# Patient Record
Sex: Male | Born: 1965 | Race: Black or African American | Hispanic: No | Marital: Married | Smoking: Current every day smoker
Health system: Southern US, Community
[De-identification: ages and names within clinical notes are randomized; demographics above are authoritative.]

## PROBLEM LIST (undated history)

## (undated) DIAGNOSIS — F209 Schizophrenia, unspecified: Secondary | ICD-10-CM

## (undated) DIAGNOSIS — F319 Bipolar disorder, unspecified: Secondary | ICD-10-CM

## (undated) HISTORY — PX: ABDOMINAL SURGERY: SHX537

---

## 2021-07-06 ENCOUNTER — Emergency Department (HOSPITAL_COMMUNITY): Payer: Medicaid Other

## 2021-07-06 ENCOUNTER — Other Ambulatory Visit: Payer: Self-pay

## 2021-07-06 ENCOUNTER — Emergency Department (HOSPITAL_COMMUNITY)
Admission: EM | Admit: 2021-07-06 | Discharge: 2021-07-06 | Disposition: A | Discharge status: Home / Self Care | Payer: Medicaid Other | Attending: Emergency Medicine | Admitting: Emergency Medicine

## 2021-07-06 ENCOUNTER — Encounter (HOSPITAL_COMMUNITY): Payer: Self-pay

## 2021-07-06 DIAGNOSIS — J189 Pneumonia, unspecified organism: Secondary | ICD-10-CM

## 2021-07-06 DIAGNOSIS — F17210 Nicotine dependence, cigarettes, uncomplicated: Secondary | ICD-10-CM | POA: Diagnosis not present

## 2021-07-06 DIAGNOSIS — K828 Other specified diseases of gallbladder: Secondary | ICD-10-CM | POA: Insufficient documentation

## 2021-07-06 DIAGNOSIS — Y9389 Activity, other specified: Secondary | ICD-10-CM | POA: Diagnosis not present

## 2021-07-06 DIAGNOSIS — M7021 Olecranon bursitis, right elbow: Secondary | ICD-10-CM | POA: Insufficient documentation

## 2021-07-06 DIAGNOSIS — R1110 Vomiting, unspecified: Secondary | ICD-10-CM | POA: Diagnosis present

## 2021-07-06 HISTORY — DX: Schizophrenia, unspecified: F20.9

## 2021-07-06 HISTORY — DX: Bipolar disorder, unspecified: F31.9

## 2021-07-06 LAB — URINALYSIS, ROUTINE W REFLEX MICROSCOPIC
Bilirubin Urine: NEGATIVE
Glucose, UA: NEGATIVE mg/dL
Hgb urine dipstick: NEGATIVE
Ketones, ur: NEGATIVE mg/dL
Leukocytes,Ua: NEGATIVE
Nitrite: NEGATIVE
Protein, ur: NEGATIVE mg/dL
Specific Gravity, Urine: <1.005 — ABNORMAL LOW (ref 1.005–1.030)
pH: 6 (ref 5.0–8.0)

## 2021-07-06 LAB — COMPREHENSIVE METABOLIC PANEL
ALT: 29 U/L (ref 0–44)
AST: 61 U/L — ABNORMAL HIGH (ref 15–41)
Albumin: 3.1 g/dL — ABNORMAL LOW (ref 3.5–5.0)
Alkaline Phosphatase: 59 U/L (ref 38–126)
Anion gap: 17 — ABNORMAL HIGH (ref 5–15)
BUN: 5 mg/dL — ABNORMAL LOW (ref 6–20)
CO2: 18 mmol/L — ABNORMAL LOW (ref 22–32)
Calcium: 8.3 mg/dL — ABNORMAL LOW (ref 8.9–10.3)
Chloride: 101 mmol/L (ref 98–111)
Creatinine, Ser: 0.53 mg/dL — ABNORMAL LOW (ref 0.61–1.24)
GFR, Estimated: >60 mL/min (ref >60)
Glucose, Bld: 70 mg/dL (ref 70–99)
Potassium: 3.2 mmol/L — ABNORMAL LOW (ref 3.5–5.1)
Sodium: 136 mmol/L (ref 135–145)
Total Bilirubin: 0.9 mg/dL (ref 0.3–1.2)
Total Protein: 6.2 g/dL — ABNORMAL LOW (ref 6.5–8.1)

## 2021-07-06 LAB — CBC
HCT: 41.8 % (ref 39.0–52.0)
Hemoglobin: 14 g/dL (ref 13.0–17.0)
MCH: 35.8 pg — ABNORMAL HIGH (ref 26.0–34.0)
MCHC: 33.5 g/dL (ref 30.0–36.0)
MCV: 106.9 fL — ABNORMAL HIGH (ref 80.0–100.0)
Platelets: 80 10*3/uL — ABNORMAL LOW (ref 150–400)
RBC: 3.91 MIL/uL — ABNORMAL LOW (ref 4.22–5.81)
RDW: 12.7 % (ref 11.5–15.5)
WBC: 8.3 10*3/uL (ref 4.0–10.5)
nRBC: 0 % (ref 0.0–0.2)

## 2021-07-06 LAB — LIPASE, BLOOD: Lipase: 48 U/L (ref 11–51)

## 2021-07-06 MED ORDER — AZITHROMYCIN 250 MG PO TABS: 250.0000 mg | ORAL_TABLET | Freq: Every day | ORAL | 0 refills | Status: DC

## 2021-07-06 MED ORDER — AMOXICILLIN 500 MG PO CAPS: 1000.0000 mg | ORAL_CAPSULE | Freq: Three times a day (TID) | ORAL | 0 refills | Status: AC

## 2021-07-06 MED ORDER — ONDANSETRON 4 MG PO TBDP
4.0000 mg | ORAL_TABLET | Freq: Once | ORAL | Status: AC | PRN
  Administered 2021-07-06: 4 mg via ORAL
  Filled 2021-07-06: qty 1

## 2021-07-06 MED ORDER — ONDANSETRON 4 MG PO TBDP: 4.0000 mg | ORAL_TABLET | Freq: Three times a day (TID) | ORAL | 0 refills | Status: DC | PRN

## 2021-07-06 MED ORDER — SODIUM CHLORIDE 0.9 % IV BOLUS
500.0000 mL | Freq: Once | INTRAVENOUS | Status: AC
  Administered 2021-07-06: 500 mL via INTRAVENOUS

## 2021-07-06 MED ORDER — IOHEXOL 350 MG/ML SOLN
80.0000 mL | Freq: Once | INTRAVENOUS | Status: AC | PRN
  Administered 2021-07-06: 80 mL via INTRAVENOUS

## 2021-07-06 NOTE — ED Notes (Signed)
Patient transported to Ultrasound 

## 2021-07-06 NOTE — Discharge Instructions (Signed)
Your workup today showed a pneumonia, so we are treating with antibiotics.  Your gallbladder showed sludge, which is a thick fluid. This can sometimes cause abdominal pain and nausea after eating. I recommend you follow up with a general surgery team (clinic listed below) for further evaluation and management of your gallbladder.  Your elbow will need to be managed by orthopedics. You may call the office listed below to set up a follow up appointment.  Return to the ER if you develop fevers, difficulty breathing, severe worsening abdominal pain, or any new, worsening or concerning symptoms.
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
## 2021-07-06 NOTE — ED Notes (Signed)
Pt provide lunch bag and ginger ale per provider

## 2021-07-06 NOTE — ED Triage Notes (Addendum)
Pt bib [employer]. Pt has not been able to eat for a month per family. Pt states he has been feeling bad and vomiting for past 4 days. Pt c/o R elbow and neck pain.   EMS VS 108/76 HR=70,  98% room air Cbg=92  20g LFA started, pt received NS given by EMS

## 2021-08-21 ENCOUNTER — Telehealth: Payer: Self-pay

## 2021-08-21 NOTE — Telephone Encounter (Signed)
Pt called requesting wound care supplies for his wound. Pt states he is completely out of them 

## 2021-08-22 NOTE — Telephone Encounter (Signed)
I don't see that we've seen him

## 2021-08-22 NOTE — Telephone Encounter (Signed)
Error on message from 08/21/21 at 5:04 PM

## 2022-09-21 IMAGING — US US ABDOMEN LIMITED
1 series · 14 of 25 positions shown · non-contrast
Comparison: None.

CLINICAL DATA: 55-year-old male with abdominal pain.

EXAM:
ULTRASOUND ABDOMEN LIMITED RIGHT UPPER QUADRANT

[Series 1: us abdomen limited ruq (liver/gb) · 14 of 46 slices shown]
[im 1/46]
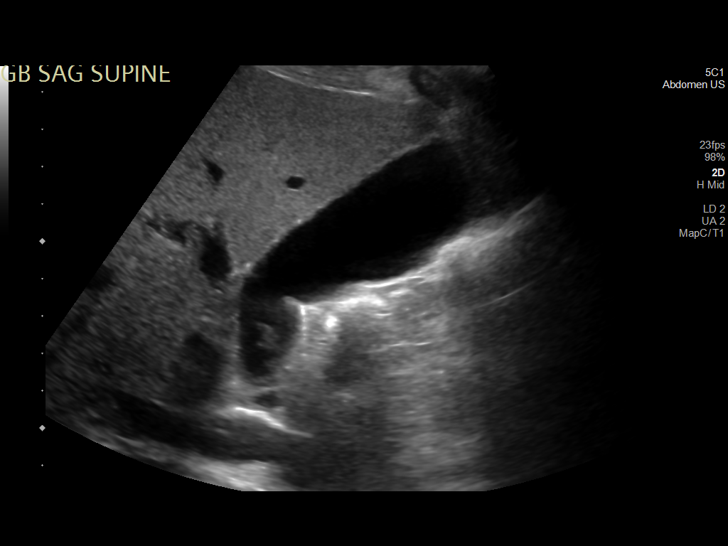
[im 4/46]
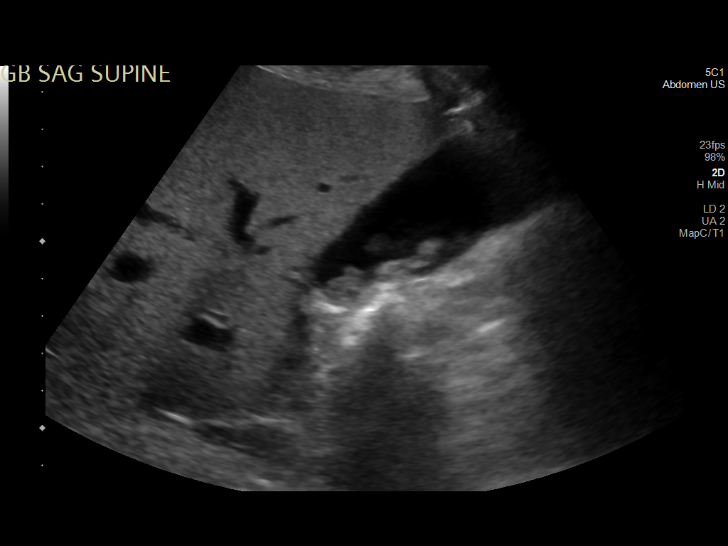
[im 8/46]
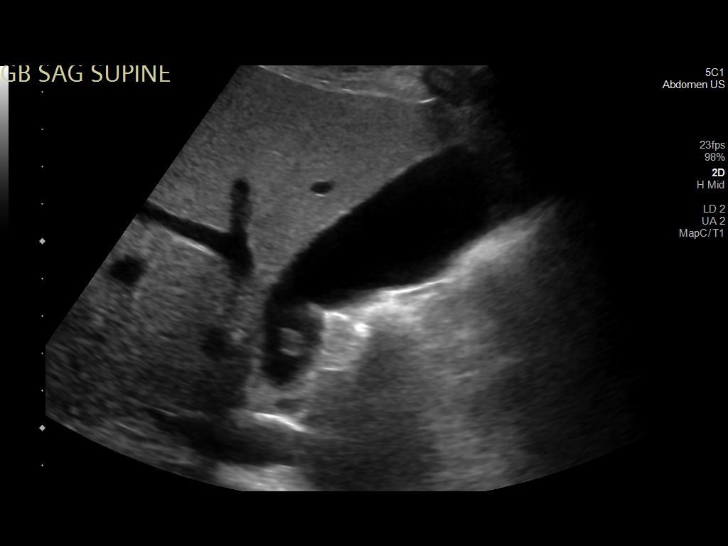
[im 12/46]
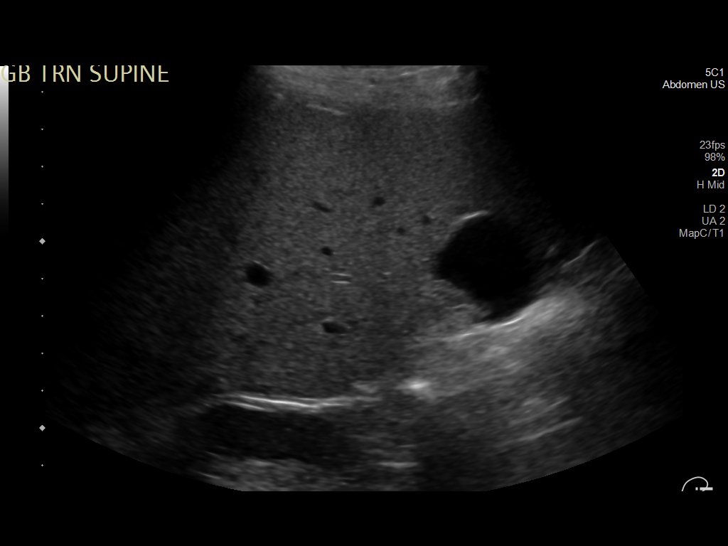
[im 16/46]
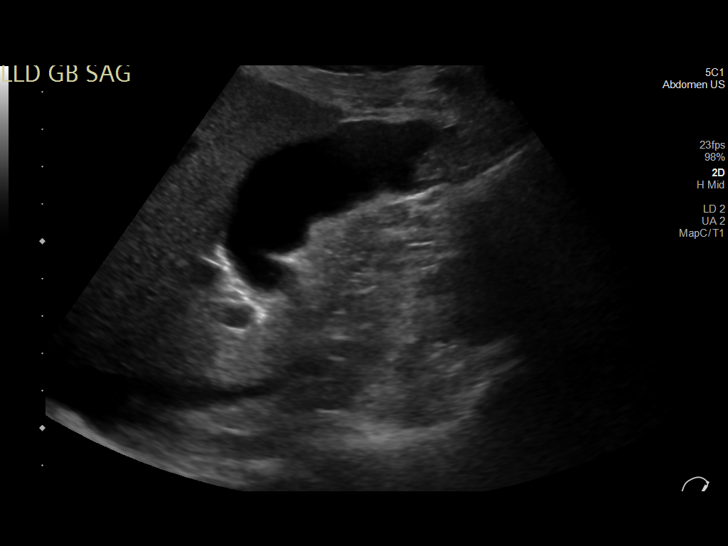
[im 17/46]
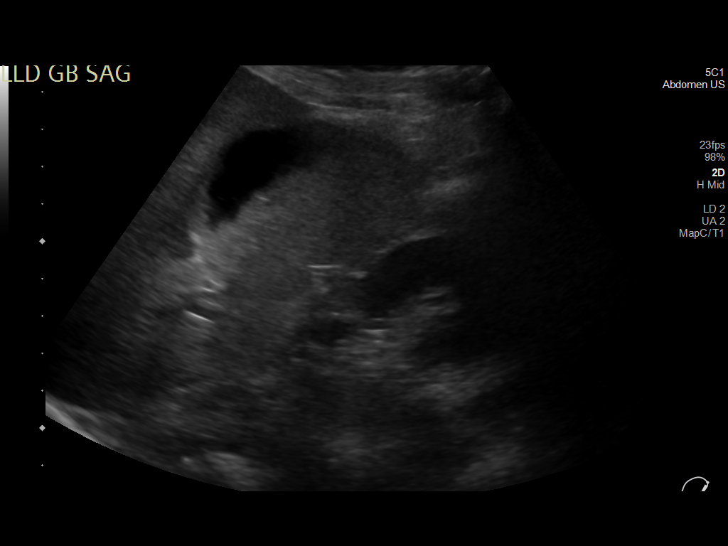
[im 21/46]
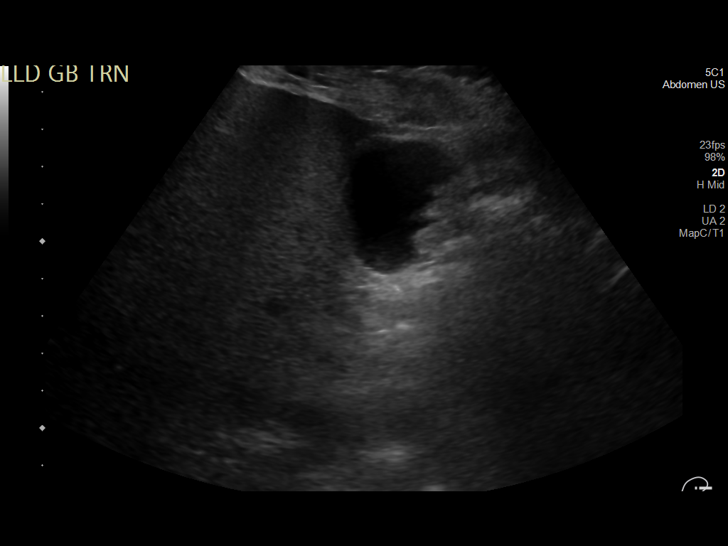
[im 25/46]
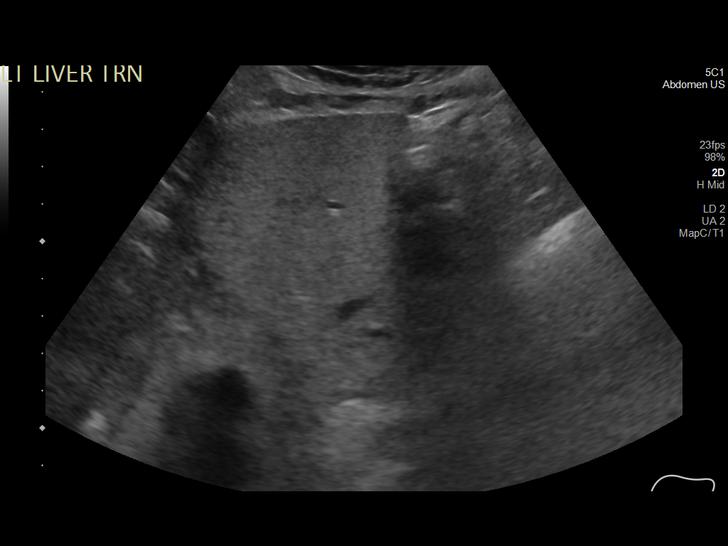
[im 29/46]
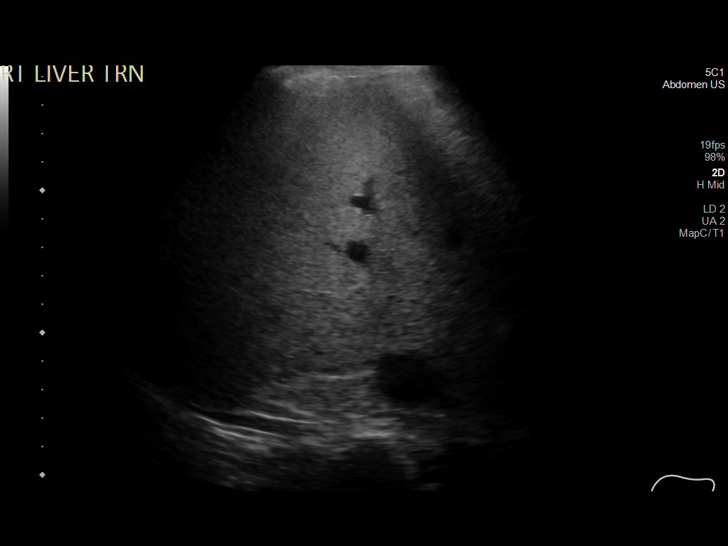
[im 31/46]
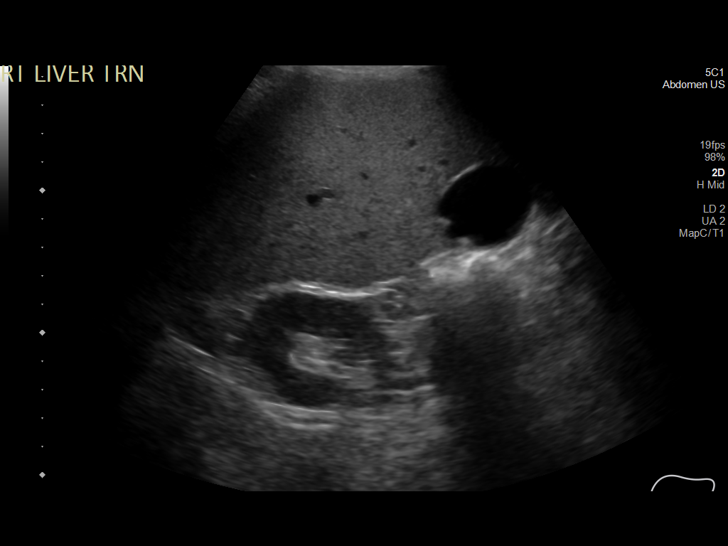
[im 34/46]
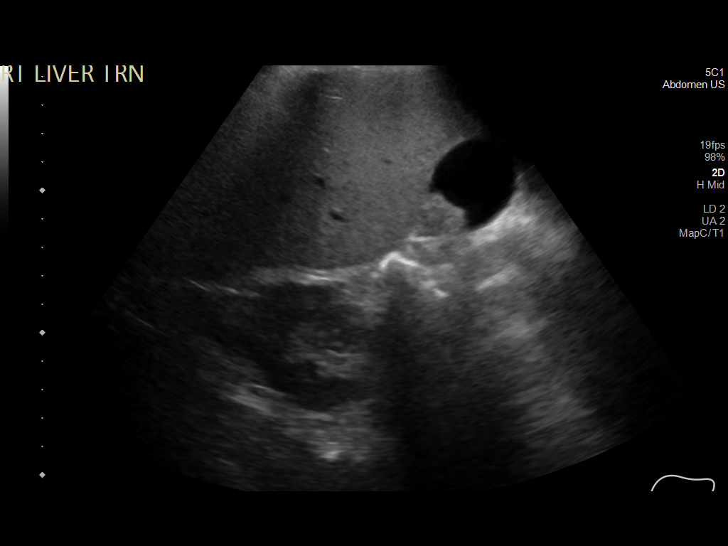
[im 38/46]
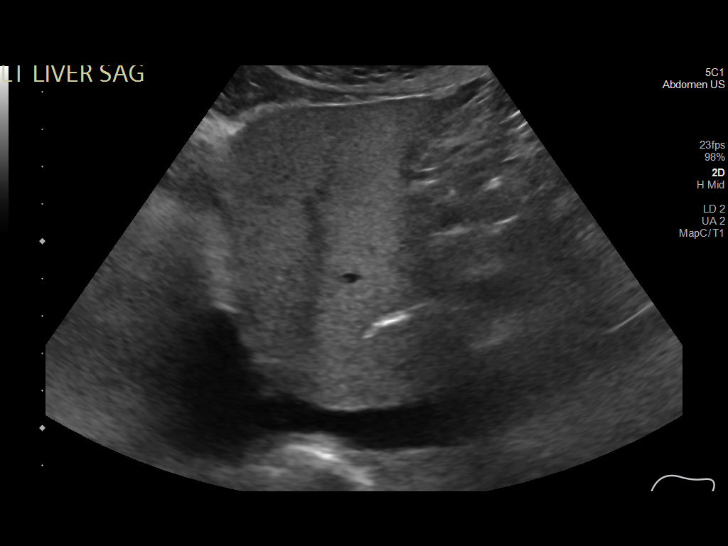
[im 42/46]
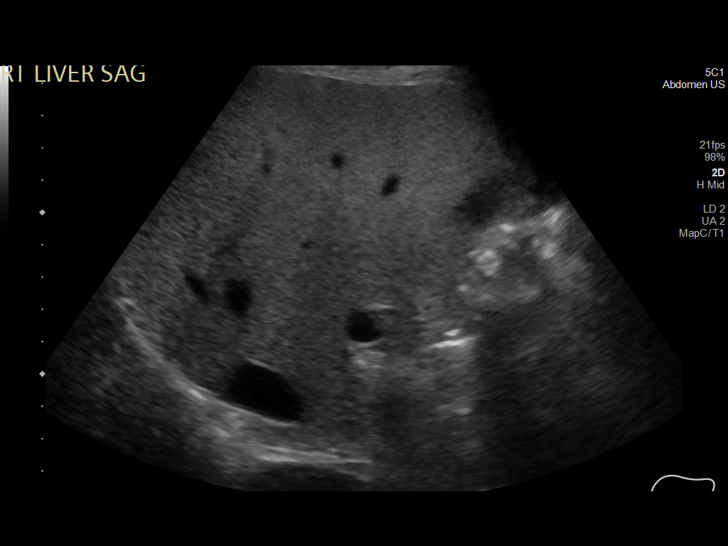
[im 46/46]
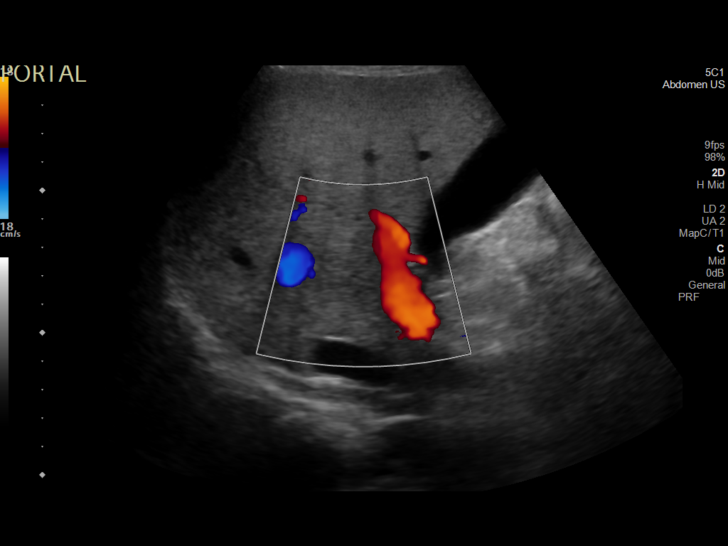

[14 of 25 positions shown; findings below may reference images not displayed]

FINDINGS: Gallbladder:

Gallbladder sludge is noted. No definite cholelithiasis identified.
No gallbladder wall thickening, pericholecystic fluid or sonographic
Murphy's sign noted.

Common bile duct:

Diameter: 5 mm no intrahepatic or extrahepatic biliary dilatation
identified.

Liver:

Diffuse increased hepatic echogenicity noted without focal hepatic
abnormalities. Portal vein is patent on color Doppler imaging with
normal direction of blood flow towards the liver.

Other: None.
IMPRESSION: 1. Gallbladder sludge. No evidence of acute cholecystitis or
definite cholelithiasis.
2. Hepatic steatosis.
3. No biliary dilatation.

## 2022-09-21 IMAGING — CT CT ABD-PELV W/ CM
2 of 5 series · 16 of 46 positions shown, 18 images · IV contrast (Omni 300)
Comparison: None.

CLINICAL DATA: 55-year-old male with abdominal and pelvic pain with
nausea and vomiting for 1 month.

EXAM:
CT ABDOMEN AND PELVIS WITH CONTRAST
TECHNIQUE: Multidetector CT imaging of the abdomen and pelvis was performed
using the standard protocol following bolus administration of
intravenous contrast.
CONTRAST:  80mL OMNIPAQUE IOHEXOL 350 MG/ML SOLN

[Series 3: a/p w/ 5mm · axial · 0.74mm/px · z∈[+858,+1253]mm · 13 of 89 slices shown, 15 images]
[im 5/89  soft-tissue]
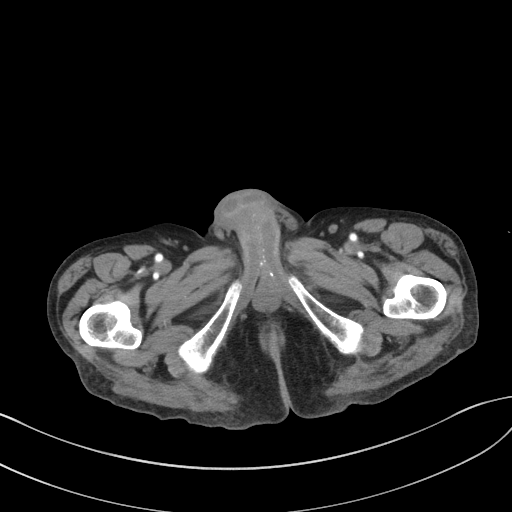
[im 5/89  bone]
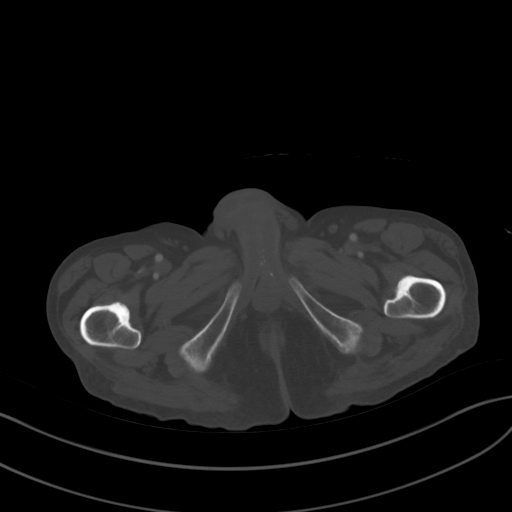
[im 10/89  soft-tissue]
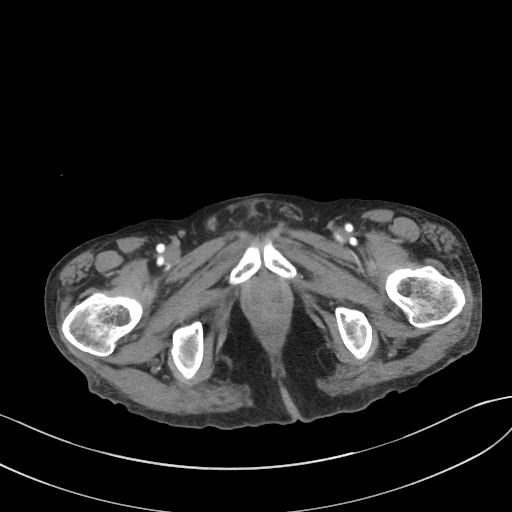
[im 20/89  soft-tissue]
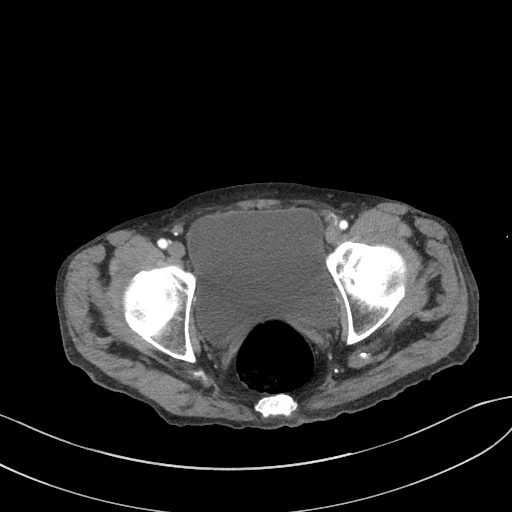
[im 25/89  soft-tissue]
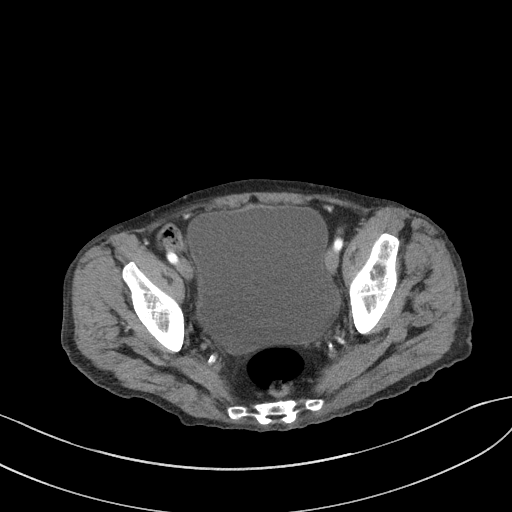
[im 30/89  soft-tissue]
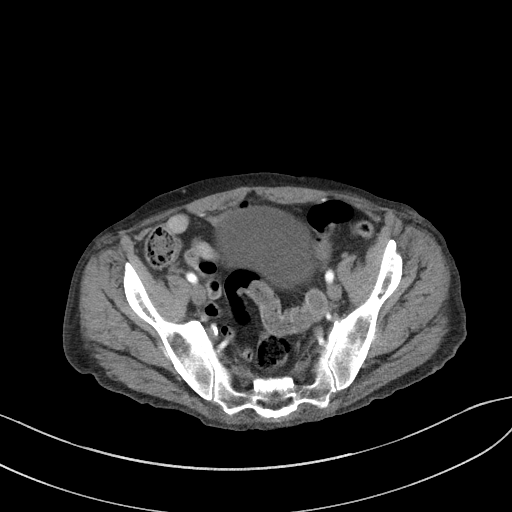
[im 40/89  soft-tissue]
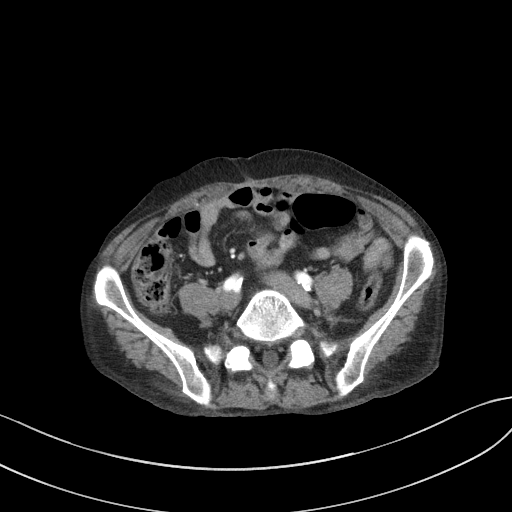
[im 45/89  soft-tissue]
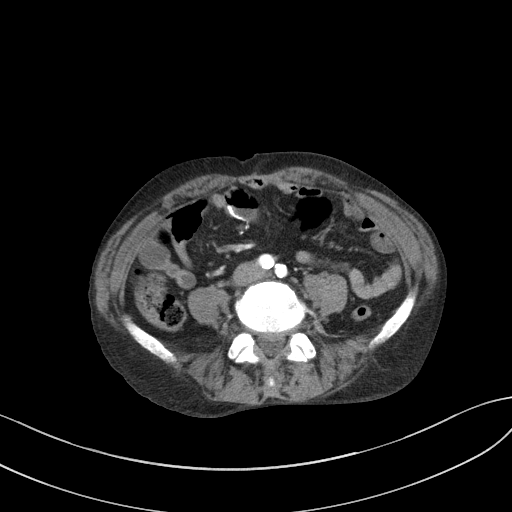
[im 49/89  soft-tissue]
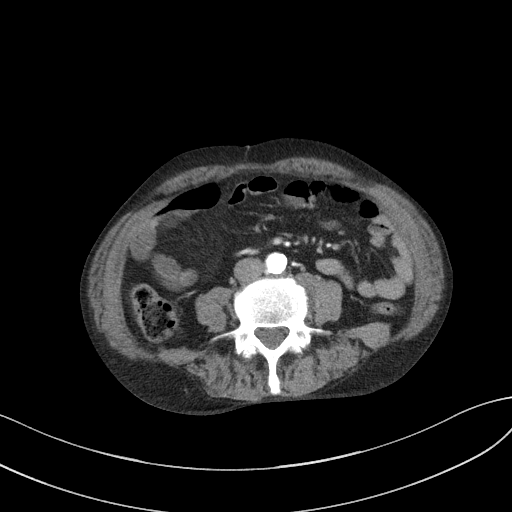
[im 59/89  soft-tissue]
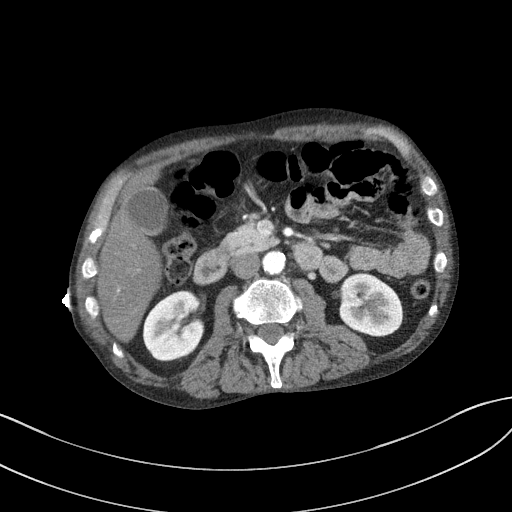
[im 59/89  bone]
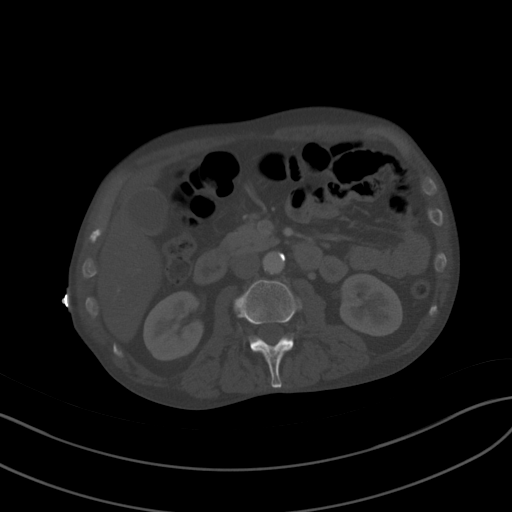
[im 64/89  soft-tissue]
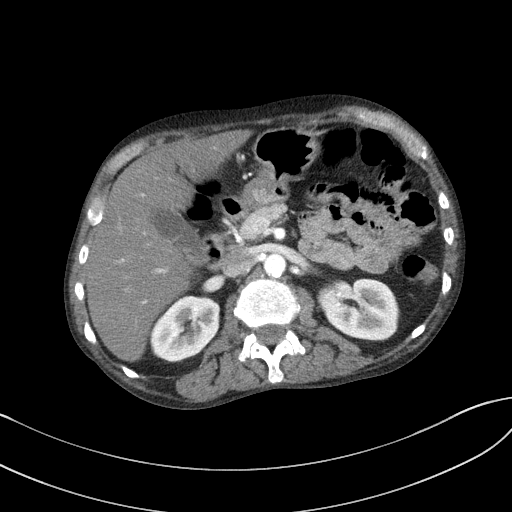
[im 69/89  soft-tissue]
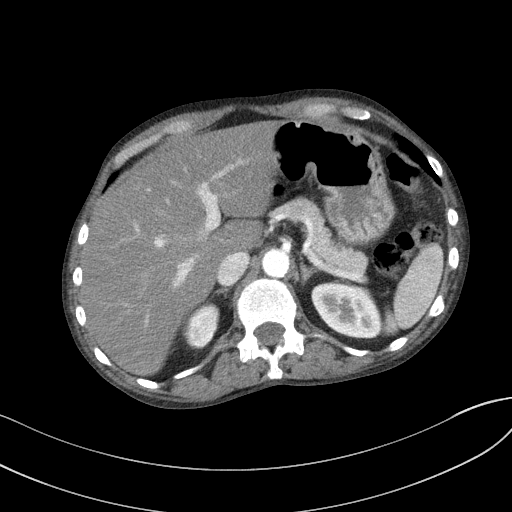
[im 79/89  soft-tissue]
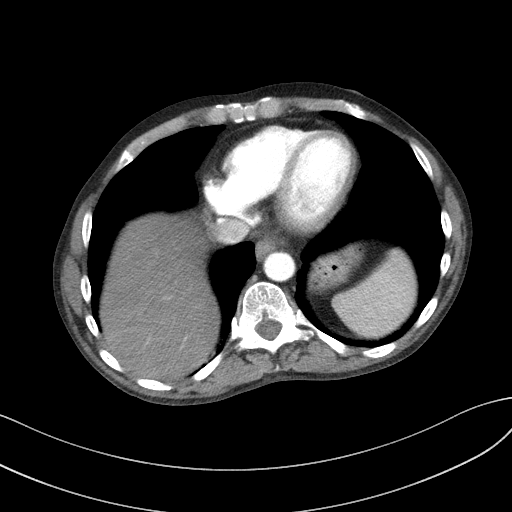
[im 84/89  soft-tissue]
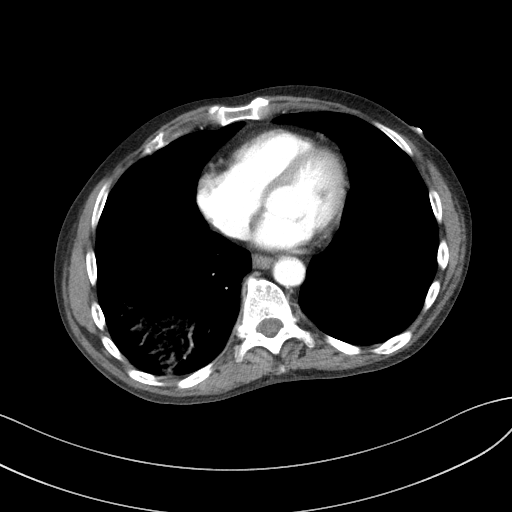

[Series 6: a/p w/ cor · coronal · 0.68mm/px · 3 of 149 slices shown]
[im 50/149  soft-tissue]
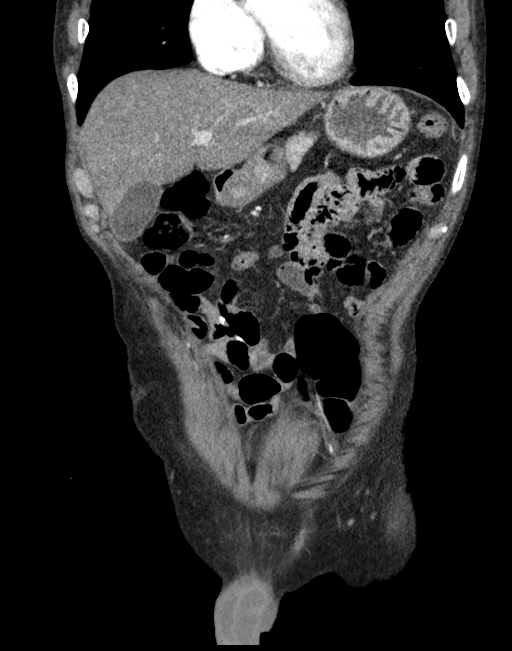
[im 66/149  soft-tissue]
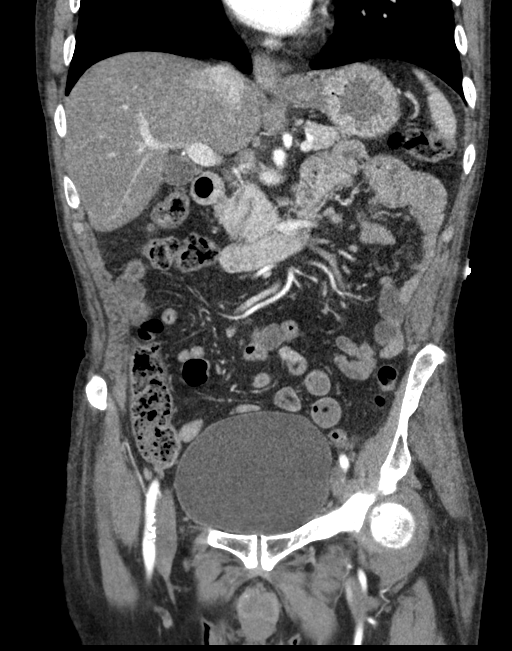
[im 83/149  soft-tissue]
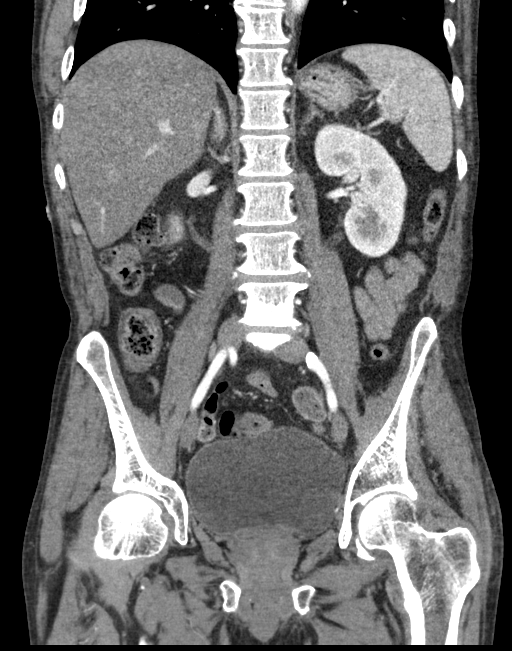

[16 of 46 positions shown; findings below may reference images not displayed]

FINDINGS: Lower chest: RIGHT LOWER lobe airspace disease is identified likely
representing pneumonia. No other acute abnormalities within the
LOWER chest noted.

Hepatobiliary: Diffuse hepatic steatosis is noted without suspicious
focal hepatic abnormalities. Increased density within the
gallbladder may represent sludge versus cholelithiasis. No CT
evidence of acute cholecystitis identified. No biliary dilatation.

Pancreas: Unremarkable

Spleen: Unremarkable

Adrenals/Urinary Tract: The kidneys, adrenal glands and bladder are
unremarkable.

Stomach/Bowel: Stomach is within normal limits. Appendix appears
normal. No evidence of bowel wall thickening, distention, or
inflammatory changes.

Vascular/Lymphatic: Aortic atherosclerosis. No enlarged abdominal or
pelvic lymph nodes.

Reproductive: Mild prostate enlargement is again noted.

Other: No ascites, focal collection or pneumoperitoneum.

Musculoskeletal: No acute or suspicious bony abnormalities are
noted.
IMPRESSION: 1. RIGHT LOWER lobe airspace disease likely representing pneumonia.
Radiographic follow-up to resolution is recommended.
2. No evidence of acute abnormality within the abdomen or pelvis.
3. Hepatic steatosis.
4. Increased density within the gallbladder may represent sludge
versus cholelithiasis. No CT evidence of acute cholecystitis.
5. Aortic Atherosclerosis ([id number]).

## 2022-09-21 IMAGING — DX DG CHEST 2V
2 series · 2 of 2 positions shown · non-contrast
Comparison: Chest radiograph dated 03/06/2021.

CLINICAL DATA: Decreased appetite, vomiting for 4 days.

EXAM:
CHEST - 2 VIEW

[chest pa]
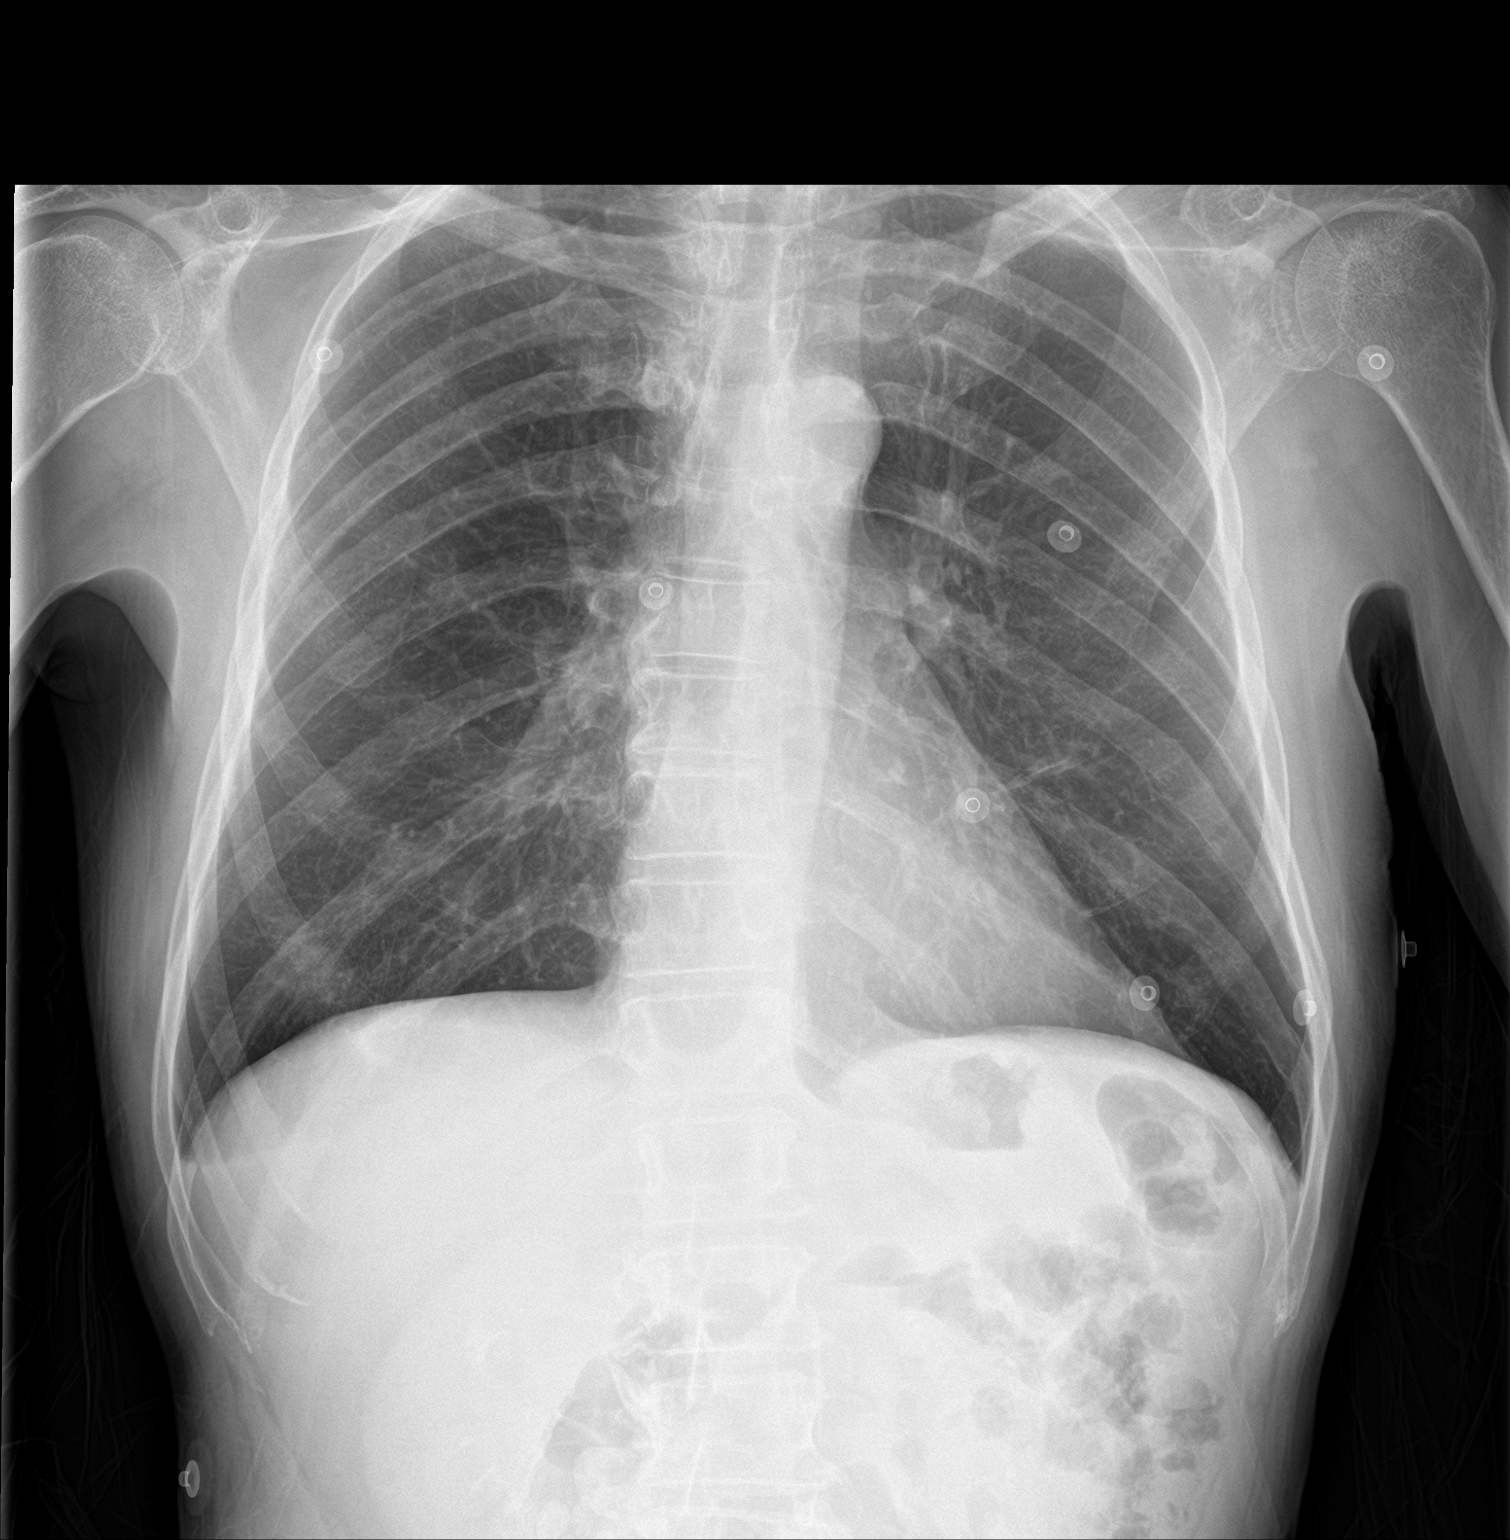

[chest lat]
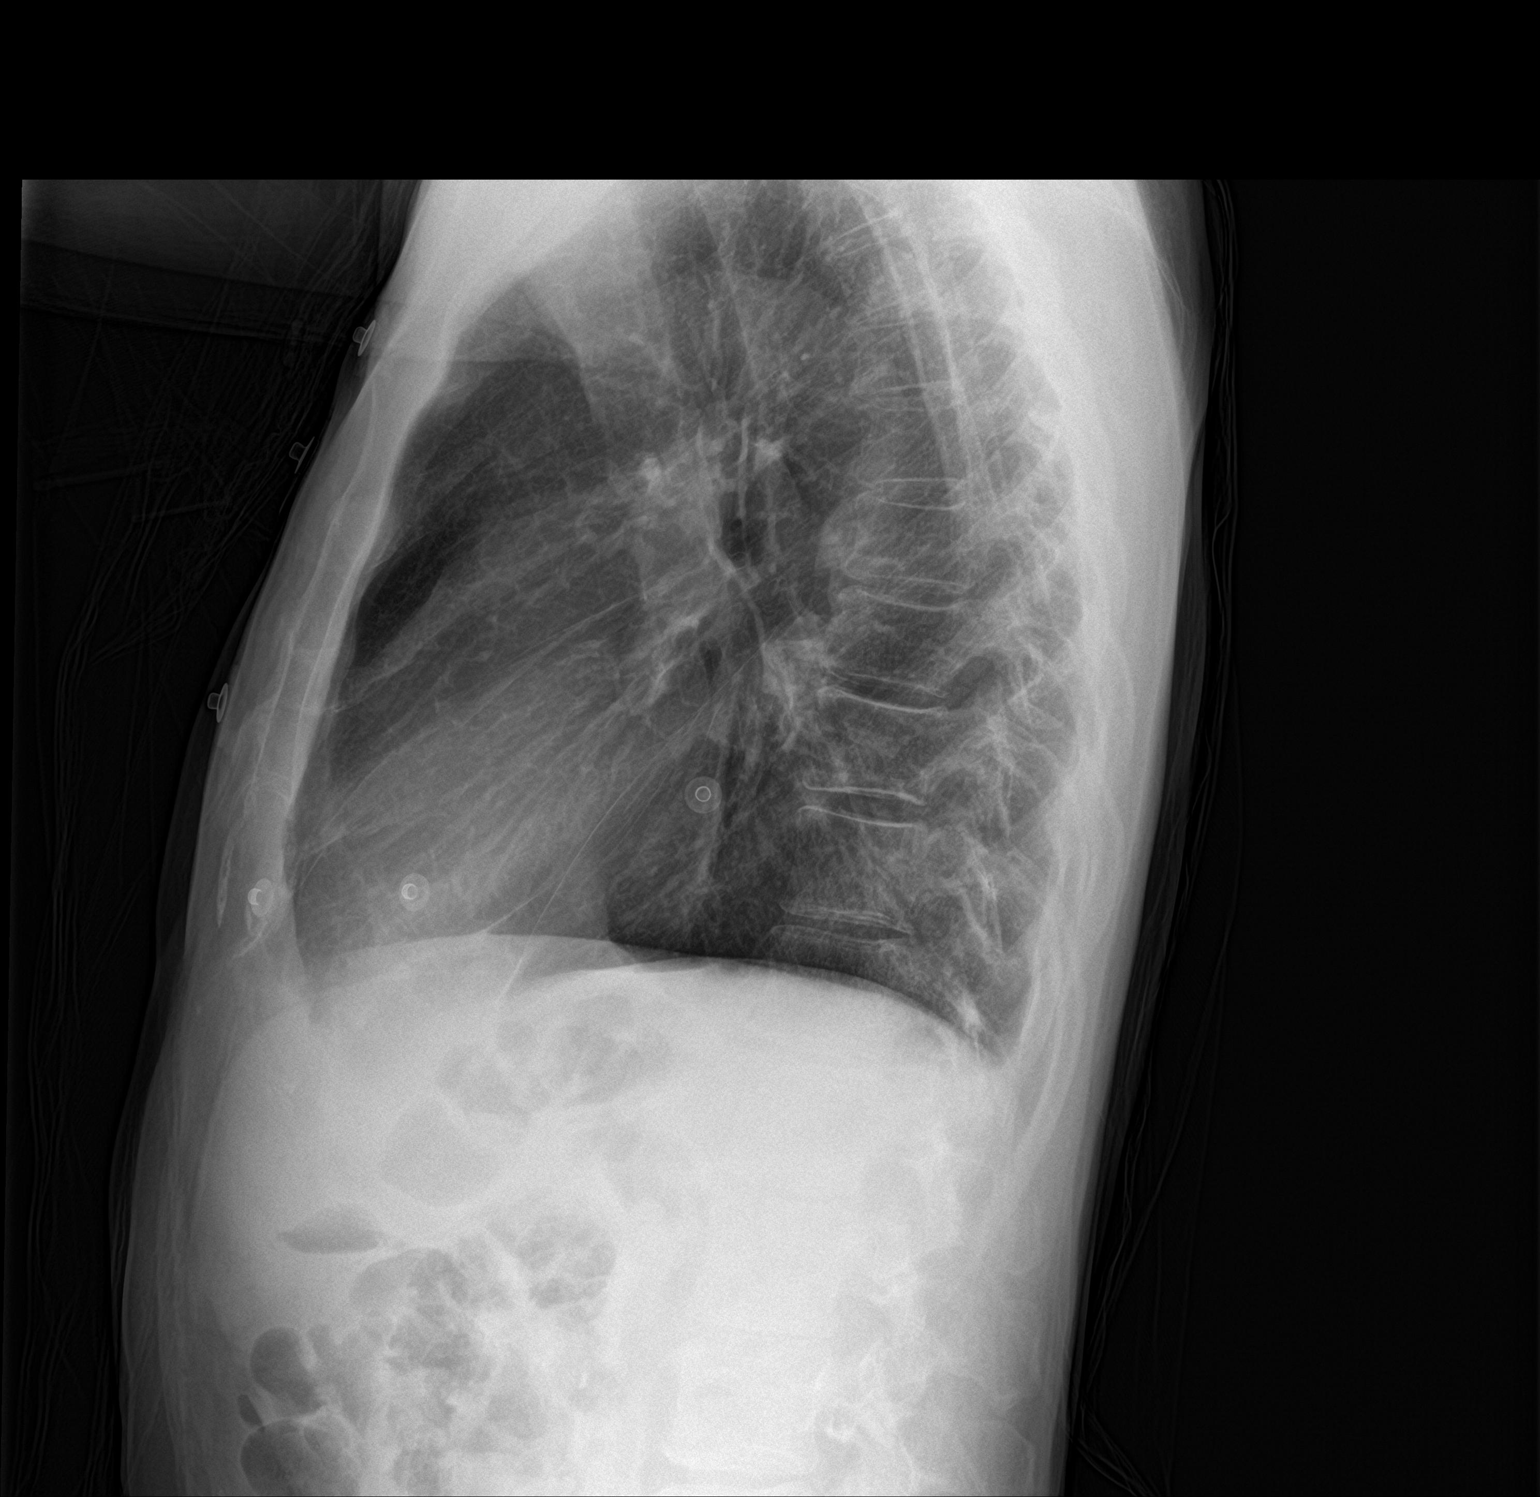

[2 of 2 positions shown; findings below may reference images not displayed]

FINDINGS: The heart size and mediastinal contours are within normal limits.
Both lungs are clear. The visualized skeletal structures are
unremarkable.
IMPRESSION: No active cardiopulmonary disease.

## 2023-04-20 DEATH — deceased
